# Patient Record
Sex: Male | Born: 1975 | Race: White | Hispanic: No | Marital: Married | Smoking: Current every day smoker
Health system: Southern US, Community
[De-identification: ages and names within clinical notes are randomized; demographics above are authoritative.]

---

## 2002-02-03 ENCOUNTER — Inpatient Hospital Stay (HOSPITAL_COMMUNITY): Admission: AC | Admit: 2002-02-03 | Discharge: 2002-02-06 | Payer: Self-pay

## 2002-02-03 ENCOUNTER — Encounter: Payer: Self-pay | Admitting: Emergency Medicine

## 2002-02-04 ENCOUNTER — Encounter: Payer: Self-pay | Admitting: General Surgery

## 2002-02-17 ENCOUNTER — Ambulatory Visit (HOSPITAL_BASED_OUTPATIENT_CLINIC_OR_DEPARTMENT_OTHER): Admission: RE | Admit: 2002-02-17 | Discharge: 2002-02-18 | Payer: Self-pay | Admitting: Orthopedic Surgery

## 2002-07-10 ENCOUNTER — Encounter (INDEPENDENT_AMBULATORY_CARE_PROVIDER_SITE_OTHER): Payer: Self-pay | Admitting: *Deleted

## 2002-07-10 ENCOUNTER — Ambulatory Visit (HOSPITAL_BASED_OUTPATIENT_CLINIC_OR_DEPARTMENT_OTHER): Admission: RE | Admit: 2002-07-10 | Discharge: 2002-07-10 | Payer: Self-pay | Admitting: Urology

## 2010-05-06 ENCOUNTER — Encounter: Payer: Self-pay | Admitting: Emergency Medicine

## 2010-05-08 ENCOUNTER — Emergency Department (HOSPITAL_COMMUNITY)
Admission: EM | Admit: 2010-05-08 | Discharge: 2010-05-08 | Payer: Self-pay | Source: Home / Self Care | Admitting: Emergency Medicine

## 2010-05-09 LAB — URINALYSIS, ROUTINE W REFLEX MICROSCOPIC
Bilirubin Urine: NEGATIVE
Ketones, ur: NEGATIVE mg/dL
Leukocytes, UA: NEGATIVE
Nitrite: NEGATIVE
Protein, ur: NEGATIVE mg/dL
Specific Gravity, Urine: 1.023 (ref 1.005–1.030)
Urine Glucose, Fasting: NEGATIVE mg/dL
Urobilinogen, UA: 0.2 mg/dL (ref 0.0–1.0)
pH: 6 (ref 5.0–8.0)

## 2010-05-09 LAB — URINE MICROSCOPIC-ADD ON

## 2010-09-01 NOTE — Op Note (Signed)
NAME:  Spencer Ward, Spencer Ward                         ACCOUNT NO.:  [id number]   MEDICAL RECORD NO.:  [id number]                   PATIENT TYPE:  INP   LOCATION:  5731                                 FACILITY:  [facility]   PHYSICIAN:  Katy Fitch. Naaman Plummer., M.D.          DATE OF BIRTH:  30-Mar-1976   DATE OF PROCEDURE:  02/05/2002  DATE OF DISCHARGE:                                 OPERATIVE REPORT   PREOPERATIVE DIAGNOSES:  Severely comminuted intra-articular fracture, right  distal radius sustained in a 40 foot fall from a tree.   POSTOPERATIVE DIAGNOSES:  Severely comminuted intra-articular fracture,  right distal radius sustained in a 40 foot fall from a tree.   OPERATION PERFORMED:  Open reduction internal fixation of right distal  radius fracture with freeze dried allograft cancellous bone graft and  application of a DVR volar plate system.   SURGEON:  Katy Fitch. Sypher, M.D.   ASSISTANT:  Jonni Sanger, P.A.   ANESTHESIA:  Axillary block supplemented by IV sedation.   SUPERVISING ANESTHESIOLOGIST:  Dr. Zoila Shutter.   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   Clinical examination by the emergency room staff and Jene Every, MD,  attending orthopedic surgeon on trauma call revealed a severely comminuted  fracture of the right distal radius.  Dr. Shelle Iron requested an upper extremity  orthopedic consult.   After informed consent, Dr. Shelle Iron completed a closed reduction of the  fracture due to significant median nerve compression due to the degree of  displacement.  Dr. Shelle Iron subsequently placed the patient  in a sugar tong  splint and requested a consultation from the upper extremity orthopedic  service.   Upon review of  the films we recommended scheduling open reduction internal  fixation with bone graft and application of a volar DVR buttress plate  system.  After informed consent, the patient is brought to the operating  room at this time.   DESCRIPTION OF PROCEDURE:  The patient was brought to the operating room and  placed in supine position upon the operating table.  Following placement of  an axillary block in the holding area by Dr. Zoila Shutter, anesthesia was  complete in the right arm.  The arm was then prepped with Betadine soap and  solution and sterilely draped.  1 gm of Ancef was administered as an IV  prophylactic antibiotic.  The procedure commenced with exsanguination of the  arm with an Esmarch bandage and placement of an arterial tourniquet to 250  mmHg at the proximal brachium.  The fracture was exposed through an extended  DVR volar incision paralleling the thenar crease and the flexor carpi  radialis tendon.   The subcutaneous  tissues were carefully divided taking care to identify the  palmaris longus, the palmar cutaneous branch of the median nerve and the  flexor carpi radialis.  The fascia overlying the flexor carpi radialis was  released and the tendons retracted in an ulnar direction.  The floor of the  flexor carpi radialis compartment was incised revealing the flexor pollicis  longus.  This was retracted in an ulnar direction revealing the pronator  quadratus.  The pronator quadratus was elevated off of the radius towards  its ulnar origin.   The fracture site was severely comminuted with marked impaction of the joint  surface on this dorsal and distal surfaces.  The styloid was comminuted as  was the lunate facet.  With the use of a Kleinert elevator and other blunt  instruments, the fracture was disimpacted from inside out technique passing  instruments from the radial aspect of the radial styloid region towards the  lunate facet.   With multiple passes of the Kleinert  elevator and the rasp, I was able to  elevate all the fracture fragments in anatomic position confirmed by C-arm  fluoroscopy.  Freeze dried cancellous allograft was then placed in the  cavity created in the cavity created by impaction of the bone fragments  filling this to a significant density to support the fracture in an anatomic  reduction.   The DVR plate system was then applied to the volar aspect of the wrist and  multiple C-arm images were used to confirm satisfactory position of the  plate.  Four nonthreaded pegs were placed deep to the articular surface  maintaining satisfactory reduction and support of the articular surface.   Further bone graft was impacted securing an anatomic reduction of the  fracture.  The cortical fragment removed on the radial aspect of the  metaphysis was replaced anatomically and the periosteum closed.  The plate  was secured to the radial shaft with the 2.5 mm self tapping cortical  screws.   An anatomic reduction of the fracture was achieved ___________ of length,  slope and tilt of the distal radius.  The pronator quadratus was then repaired to the radial periosteum on the  distal radius followed by closure of the skin with subdermal sutures of 3-0  Vicryl and intradermal 3-0 Prolene.   Compressive dressing was applied with a sugar tong splint maintaining the  forearm in neutral position.  There were no apparent complications.  The  patient tolerated surgery and anesthesia well.  He was transferred to the  recovery room with stable vital signs.                                                 Katy Fitch Naaman Plummer., M.D.    RVS/MEDQ  D:  02/05/2002  T:  02/05/2002  Job:  [id number]   cc:   Jene Every, MD  [address]  [place]  [place] [address]  Fax: [phone]

## 2010-09-01 NOTE — Op Note (Signed)
NAME:  Spencer Ward, Spencer Ward                         ACCOUNT NO.:  [id number]   MEDICAL RECORD NO.:  [id number]                   PATIENT TYPE:  AMB   LOCATION:  NESC                                 FACILITY:  [facility]   PHYSICIAN:  [place] Service, M.D.             DATE OF BIRTH:  [birth year]   DATE OF PROCEDURE:  07/10/2002  DATE OF DISCHARGE:                                 OPERATIVE REPORT   PREOPERATIVE DIAGNOSIS:  A 35 year old male, longstanding painless swelling  in the right hemiscrotum.  Scrotal ultrasound confirms 8 x 6 x 7 cm right  hydrocele, single internal septation, no internal echos, normal parenchyma  right and left testis.  Insignificant amount of fluid on the left side.  The  patient has undesired fertility.  He and wife agree to outpatient vasectomy.   POSTOPERATIVE DIAGNOSIS:  A 35 year old male, longstanding painless swelling  in the right hemiscrotum.  Scrotal ultrasound confirms 8 x 6 x 7 cm right  hydrocele, single internal septation, no internal echos, normal parenchyma  right and left testis.  Insignificant amount of fluid on the left side.  The  patient has undesired fertility.  He and wife agree to outpatient vasectomy.   PROCEDURES:  1. Right hydrocelectomy.  2. Vasectomy.   DESCRIPTION OF PROCEDURE:  The patient was prepped and draped in the supine  position after institution of an adequate level of general anesthesia.  Transverse incision was made in the anterior aspect of the right  hemiscrotum, carried down to the skin and dartos.  General pressure on the  wound edges produced a pearl-gray hydrocele sac.  Continued blunt dissection  freed the sac from its surrounding subcutaneous attachments.  Sac was  incised longitudinally and drained about 250-300 mL of straw-colored fluid.  Cyst was then sewn back on itself.  No attempt was made to resect the cyst  wall.  Testis appeared normal.  Appendix testis was fulgurated.  Testis was  then replaced into the  right hemiscrotum.  The deep dartos layer was closed  with a running suture of 3-0 chromic; superficial dartos was closed with a  running suture of 2-0 Vicryl; skin was closed with interrupted sutures of 3-  0 chromic.  Prior to closure, vas had been brought to the anterior aspect of  the cord, divided proximally and distally.  Approximately a 1.5 cm segment  was removed.  Each end was ligated with 4-0 Vicryl.   Attention was then focused on the left hemiscrotum.  Vas was brought to the  anterior scrotal skin.  Small transverse incision was made through the skin  and dartos.  Vas was brought up, ligated proximally and distally.  About a 1  cm segment was removed.  Dartos was closed with a single suture of 3-0  chromic.  Skin was closed with a single suture of 3-0 chromic.  The  patient's wound was covered with Bacitracin ointment, dry gauze,  athletic  supporter, and an ice bag. He was returned to recovery in satisfactory  condition.                                               Boston Service, M.D.    RH/MEDQ  D:  07/10/2002  T:  07/10/2002  Job:  [id number]

## 2010-09-01 NOTE — Op Note (Signed)
NAME:  Spencer Ward, Spencer Ward                         ACCOUNT NO.:  [id number]   MEDICAL RECORD NO.:  [id number]                   PATIENT TYPE:  AMB   LOCATION:  DSC                                  FACILITY:  [facility]   PHYSICIAN:  Leonides Grills, M.D.                  DATE OF BIRTH:  12/01/1975   DATE OF PROCEDURE:  02/17/2002  DATE OF DISCHARGE:                                 OPERATIVE REPORT   PREOPERATIVE DIAGNOSIS:  Right closed calcaneus fracture.   POSTOPERATIVE DIAGNOSIS:  Right closed calcaneus fracture.   PROCEDURE:  Open reduction and internal fixation of right calcaneus  fracture.   ANESTHESIA:  General endotracheal tube.   SURGEON:  Leonides Grills, M.D.   ASSISTANT:  Lianne Cure, P.A.   ESTIMATED BLOOD LOSS:  Minimal.   TOURNIQUET TIME:  Approximately 1-1/2 hours.   COMPLICATIONS:  None.   DISPOSITION:  Stable to the PAR.   INDICATIONS:  This is a 35 year old male who fell 40 feet out of a tree and  sustained a right closed calcaneus fracture as well as a right comminuted  distal radius fracture.  The distal radius fracture was already fixed by  Katy Fitch. Sypher, M.D., and after elevation for two weeks for soft tissue  swelling as well as recovery, he was brought back for open reduction and  internal fixation.  He was explained the risks, which include infection,  nerve or vessel injury, skin breakdown, arthritis, stiffness, and possible  fusion in the future, were all explained, questions were encouraged and  answered.   DESCRIPTION OF PROCEDURE:  Patient brought to the operating room and placed  in the supine position initially after adequate general endotracheal tube  anesthesia was administered as well as Ancef 1 g IV piggyback.  A proximally-  placed thigh tourniquet was then placed and the patient was placed in a  lateral decubitus position with the right operative side up.  The right  lower extremity was then prepped and draped in a sterile manner.  An  L-  shaped [place] extensile approach to the lateral wall of the calcaneus was  then made, the sural nerve was identified in the proximalmost aspect of the  wound and protected, retracting anteriorly.  We then identified the lateral  wall, which was then removed.  Once the lateral wall was removed, we then  identified the two fracture fragments of the lateral third of the posterior  facet.  We then placed a Shands pin into the calcaneal tuber and with  distraction, valgus and medial translation, we then reduced the medial wall  of the calcaneus and provisionally fixed this with a 2.0 mm K-wire.  Once  this was provisionally fixed, we then reduced posterior facet as well as the  angle of the same.  Once this was fixed with 1.25 mm K-wires, we then  applied a [place]nel plate that was cut and bent  anatomically.  At this point  we also placed the lateral wall back onto the calcaneus and we also obtained  C-arm views in the lateral and axial views to show that the medial wall was  reduced as well as the posterior facet.  The posterior facet was also  visualized and palpated with a Therapist, nutritional.  Once this was completely  copiously irrigated with normal saline and the plate was applied, we then  applied compression screws across using the plate to reduce the lateral  wall.  This had an excellent compression of the lateral wall as well as  maintenance of the reduction.  The 2.0 mm K-wire was then removed.  The K-  wires that were holding the posterior facet fragment were backed out, cut,  and tamped back into place.  Final x-rays in the lateral, Broden, and axial  views were obtained and showed an anatomic reduction and maintenance of the  reduction.  Also, too, of note, we used a no-touch technique for the flap  that was elevated, and K-wires were placed in the corner of the talus and  bent with a fish-tip sucker to hold the flap retracted without touching it  the entire case.  At the end of  the case the K-wires were removed from the  talus, and the flap was viable.  Hemostasis was obtained.  The tourniquet  was deflated after an hour and a half, minimal bleeding.  The wound was  closed with 2-0 Vicryl, skin was closed with 4-0 nylon.  A sterile dressing  was applied, Jones dressing applied with the ankle in neutral dorsiflexion.  Patient stable to PAR.                                               Leonides Grills, M.D.    PB/MEDQ  D:  02/17/2002  T:  02/18/2002  Job:  [id number]
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
## 2015-01-03 ENCOUNTER — Inpatient Hospital Stay (HOSPITAL_COMMUNITY)
Admission: EM | Admit: 2015-01-03 | Discharge: 2015-01-06 | DRG: 958 | Disposition: A | Discharge status: Home / Self Care | Payer: Self-pay | Attending: General Surgery | Admitting: General Surgery

## 2015-01-03 ENCOUNTER — Encounter (HOSPITAL_COMMUNITY): Payer: Self-pay

## 2015-01-03 ENCOUNTER — Emergency Department (HOSPITAL_COMMUNITY): Payer: Self-pay | Admitting: Certified Registered"

## 2015-01-03 ENCOUNTER — Emergency Department (HOSPITAL_COMMUNITY): Payer: Self-pay

## 2015-01-03 ENCOUNTER — Encounter (HOSPITAL_COMMUNITY): Admission: EM | Disposition: A | Discharge status: Home / Self Care | Payer: Self-pay | Source: Home / Self Care

## 2015-01-03 ENCOUNTER — Emergency Department (HOSPITAL_COMMUNITY): Payer: MEDICAID | Admitting: Certified Registered"

## 2015-01-03 ENCOUNTER — Emergency Department (HOSPITAL_COMMUNITY): Payer: MEDICAID

## 2015-01-03 DIAGNOSIS — S41112A Laceration without foreign body of left upper arm, initial encounter: Secondary | ICD-10-CM

## 2015-01-03 DIAGNOSIS — N179 Acute kidney failure, unspecified: Secondary | ICD-10-CM | POA: Diagnosis not present

## 2015-01-03 DIAGNOSIS — S55192A Other specified injury of radial artery at forearm level, left arm, initial encounter: Secondary | ICD-10-CM

## 2015-01-03 DIAGNOSIS — S270XXA Traumatic pneumothorax, initial encounter: Secondary | ICD-10-CM | POA: Diagnosis present

## 2015-01-03 DIAGNOSIS — S45102A Unspecified injury of brachial artery, left side, initial encounter: Secondary | ICD-10-CM

## 2015-01-03 DIAGNOSIS — S45192A Other specified injury of brachial artery, left side, initial encounter: Secondary | ICD-10-CM

## 2015-01-03 DIAGNOSIS — S45112A Laceration of brachial artery, left side, initial encounter: Secondary | ICD-10-CM | POA: Diagnosis present

## 2015-01-03 DIAGNOSIS — S2249XA Multiple fractures of ribs, unspecified side, initial encounter for closed fracture: Secondary | ICD-10-CM

## 2015-01-03 DIAGNOSIS — S55112A Laceration of radial artery at forearm level, left arm, initial encounter: Principal | ICD-10-CM | POA: Diagnosis present

## 2015-01-03 DIAGNOSIS — S2242XA Multiple fractures of ribs, left side, initial encounter for closed fracture: Secondary | ICD-10-CM | POA: Diagnosis present

## 2015-01-03 DIAGNOSIS — S0101XA Laceration without foreign body of scalp, initial encounter: Secondary | ICD-10-CM | POA: Diagnosis present

## 2015-01-03 DIAGNOSIS — T07XXXA Unspecified multiple injuries, initial encounter: Secondary | ICD-10-CM

## 2015-01-03 DIAGNOSIS — F17210 Nicotine dependence, cigarettes, uncomplicated: Secondary | ICD-10-CM | POA: Diagnosis present

## 2015-01-03 DIAGNOSIS — S51011A Laceration without foreign body of right elbow, initial encounter: Secondary | ICD-10-CM | POA: Diagnosis present

## 2015-01-03 DIAGNOSIS — IMO0002 Reserved for concepts with insufficient information to code with codable children: Secondary | ICD-10-CM

## 2015-01-03 DIAGNOSIS — S2239XA Fracture of one rib, unspecified side, initial encounter for closed fracture: Secondary | ICD-10-CM

## 2015-01-03 DIAGNOSIS — D62 Acute posthemorrhagic anemia: Secondary | ICD-10-CM | POA: Diagnosis present

## 2015-01-03 DIAGNOSIS — K5900 Constipation, unspecified: Secondary | ICD-10-CM | POA: Diagnosis not present

## 2015-01-03 DIAGNOSIS — S2232XA Fracture of one rib, left side, initial encounter for closed fracture: Secondary | ICD-10-CM | POA: Diagnosis present

## 2015-01-03 HISTORY — PX: RADIAL ARTERY HARVEST: SHX5067

## 2015-01-03 HISTORY — PX: ARTERY REPAIR: SHX559

## 2015-01-03 HISTORY — PX: VEIN HARVEST: SHX6363

## 2015-01-03 LAB — RAPID URINE DRUG SCREEN, HOSP PERFORMED
Amphetamines: NOT DETECTED
Barbiturates: NOT DETECTED
Benzodiazepines: POSITIVE — AB
Cocaine: NOT DETECTED
Opiates: POSITIVE — AB
Tetrahydrocannabinol: POSITIVE — AB

## 2015-01-03 LAB — BASIC METABOLIC PANEL
Anion gap: 10 (ref 5–15)
BUN: 10 mg/dL (ref 6–20)
CO2: 23 mmol/L (ref 22–32)
CREATININE: 1.02 mg/dL (ref 0.61–1.24)
Calcium: 8.5 mg/dL — ABNORMAL LOW (ref 8.9–10.3)
Chloride: 106 mmol/L (ref 101–111)
GFR calc Af Amer: >60 mL/min (ref >60)
GLUCOSE: 140 mg/dL — AB (ref 65–99)
POTASSIUM: 4.8 mmol/L (ref 3.5–5.1)
SODIUM: 139 mmol/L (ref 135–145)

## 2015-01-03 LAB — CBC
HCT: 44.8 % (ref 39.0–52.0)
HCT: 30.5 % — ABNORMAL LOW (ref 39.0–52.0)
Hemoglobin: 15.7 g/dL (ref 13.0–17.0)
Hemoglobin: 10.8 g/dL — ABNORMAL LOW (ref 13.0–17.0)
MCH: 30.8 pg (ref 26.0–34.0)
MCH: 30.8 pg (ref 26.0–34.0)
MCHC: 35 g/dL (ref 30.0–36.0)
MCHC: 35.4 g/dL (ref 30.0–36.0)
MCV: 87.8 fL (ref 78.0–100.0)
MCV: 86.9 fL (ref 78.0–100.0)
PLATELETS: 185 10*3/uL (ref 150–400)
Platelets: 329 10*3/uL (ref 150–400)
RBC: 5.1 MIL/uL (ref 4.22–5.81)
RBC: 3.51 MIL/uL — ABNORMAL LOW (ref 4.22–5.81)
RDW: 12.8 % (ref 11.5–15.5)
RDW: 13.2 % (ref 11.5–15.5)
WBC: 19.9 10*3/uL — ABNORMAL HIGH (ref 4.0–10.5)
WBC: 18.4 10*3/uL — ABNORMAL HIGH (ref 4.0–10.5)

## 2015-01-03 LAB — COMPREHENSIVE METABOLIC PANEL WITH GFR
ALT: 186 U/L — ABNORMAL HIGH (ref 17–63)
AST: 202 U/L — ABNORMAL HIGH (ref 15–41)
Albumin: 4.2 g/dL (ref 3.5–5.0)
Alkaline Phosphatase: 46 U/L (ref 38–126)
Anion gap: 14 (ref 5–15)
BUN: 12 mg/dL (ref 6–20)
CO2: 18 mmol/L — ABNORMAL LOW (ref 22–32)
Calcium: 8.4 mg/dL — ABNORMAL LOW (ref 8.9–10.3)
Chloride: 107 mmol/L (ref 101–111)
Creatinine, Ser: 1.31 mg/dL — ABNORMAL HIGH (ref 0.61–1.24)
GFR calc Af Amer: >60 mL/min
GFR calc non Af Amer: >60 mL/min
Glucose, Bld: 228 mg/dL — ABNORMAL HIGH (ref 65–99)
Potassium: 4 mmol/L (ref 3.5–5.1)
Sodium: 139 mmol/L (ref 135–145)
Total Bilirubin: 0.9 mg/dL (ref 0.3–1.2)
Total Protein: 6.4 g/dL — ABNORMAL LOW (ref 6.5–8.1)

## 2015-01-03 LAB — TYPE AND SCREEN
ABO/RH(D): A POS
Antibody Screen: NEGATIVE
Unit division: 0
Unit division: 0

## 2015-01-03 LAB — I-STAT CG4 LACTIC ACID, ED: Lactic Acid, Venous: 3.83 mmol/L (ref 0.5–2.0)

## 2015-01-03 LAB — URINALYSIS, ROUTINE W REFLEX MICROSCOPIC
Bilirubin Urine: NEGATIVE
GLUCOSE, UA: NEGATIVE mg/dL
KETONES UR: NEGATIVE mg/dL
LEUKOCYTES UA: NEGATIVE
NITRITE: NEGATIVE
PH: 5 (ref 5.0–8.0)
Protein, ur: NEGATIVE mg/dL
Specific Gravity, Urine: 1.025 (ref 1.005–1.030)
Urobilinogen, UA: 0.2 mg/dL (ref 0.0–1.0)

## 2015-01-03 LAB — PREPARE FRESH FROZEN PLASMA
UNIT DIVISION: 0
UNIT DIVISION: 0

## 2015-01-03 LAB — PROTIME-INR
INR: 1.11 (ref 0.00–1.49)
Prothrombin Time: 14.5 s (ref 11.6–15.2)

## 2015-01-03 LAB — URINE MICROSCOPIC-ADD ON

## 2015-01-03 LAB — SURGICAL PCR SCREEN
MRSA, PCR: NEGATIVE
Staphylococcus aureus: NEGATIVE

## 2015-01-03 LAB — ABO/RH: ABO/RH(D): A POS

## 2015-01-03 LAB — BLOOD PRODUCT ORDER (VERBAL) VERIFICATION

## 2015-01-03 SURGERY — REPAIR, ARTERY, BRACHIAL
Anesthesia: General | Site: Leg Upper | Laterality: Left

## 2015-01-03 MED ORDER — HYDROMORPHONE HCL 1 MG/ML IJ SOLN
2.0000 mg | Freq: Once | INTRAMUSCULAR | Status: AC
  Administered 2015-01-03: 2 mg via INTRAVENOUS
  Filled 2015-01-03: qty 2

## 2015-01-03 MED ORDER — LACTATED RINGERS IV SOLN
INTRAVENOUS | Status: DC | PRN
  Administered 2015-01-03 (×4): via INTRAVENOUS

## 2015-01-03 MED ORDER — LIDOCAINE HCL (CARDIAC) 20 MG/ML IV SOLN
INTRAVENOUS | Status: AC
  Administered 2015-01-03: 100 mg via INTRAVENOUS
  Filled 2015-01-03: qty 5

## 2015-01-03 MED ORDER — TETANUS-DIPHTH-ACELL PERTUSSIS 5-2.5-18.5 LF-MCG/0.5 IM SUSP
INTRAMUSCULAR | Status: AC
  Filled 2015-01-03: qty 0.5

## 2015-01-03 MED ORDER — IOHEXOL 350 MG/ML SOLN
100.0000 mL | Freq: Once | INTRAVENOUS | Status: AC | PRN
  Administered 2015-01-03: 100 mL via INTRAVENOUS

## 2015-01-03 MED ORDER — DIPHENHYDRAMINE HCL 12.5 MG/5ML PO ELIX
12.5000 mg | ORAL_SOLUTION | Freq: Four times a day (QID) | ORAL | Status: DC | PRN
  Filled 2015-01-03: qty 5

## 2015-01-03 MED ORDER — MIDAZOLAM HCL 2 MG/2ML IJ SOLN
INTRAMUSCULAR | Status: AC
  Filled 2015-01-03: qty 4

## 2015-01-03 MED ORDER — DEXAMETHASONE SODIUM PHOSPHATE 4 MG/ML IJ SOLN
INTRAMUSCULAR | Status: DC | PRN
  Administered 2015-01-03: 4 mg via INTRAVENOUS

## 2015-01-03 MED ORDER — CALCIUM CHLORIDE 10 % IV SOLN
INTRAVENOUS | Status: AC
  Filled 2015-01-03: qty 10

## 2015-01-03 MED ORDER — IPRATROPIUM-ALBUTEROL 0.5-2.5 (3) MG/3ML IN SOLN
3.0000 mL | Freq: Four times a day (QID) | RESPIRATORY_TRACT | Status: DC
  Administered 2015-01-03 – 2015-01-04 (×6): 3 mL via RESPIRATORY_TRACT
  Filled 2015-01-03 (×6): qty 3

## 2015-01-03 MED ORDER — HYDROMORPHONE 0.3 MG/ML IV SOLN
INTRAVENOUS | Status: DC
  Administered 2015-01-03: 3.9 mg via INTRAVENOUS
  Administered 2015-01-03: 10:00:00 via INTRAVENOUS
  Administered 2015-01-03: 4.5 mg via INTRAVENOUS
  Administered 2015-01-03: 2.4 mg via INTRAVENOUS
  Administered 2015-01-04: 3.9 mg via INTRAVENOUS
  Administered 2015-01-04: 3.72 mg via INTRAVENOUS
  Filled 2015-01-03 (×2): qty 25

## 2015-01-03 MED ORDER — FENTANYL CITRATE (PF) 250 MCG/5ML IJ SOLN
INTRAMUSCULAR | Status: AC
  Filled 2015-01-03: qty 5

## 2015-01-03 MED ORDER — PROTAMINE SULFATE 10 MG/ML IV SOLN
INTRAVENOUS | Status: AC
  Filled 2015-01-03: qty 5

## 2015-01-03 MED ORDER — SODIUM CHLORIDE 0.9 % IJ SOLN: 9.0000 mL | INTRAMUSCULAR | Status: DC | PRN

## 2015-01-03 MED ORDER — PROTAMINE SULFATE 10 MG/ML IV SOLN
INTRAVENOUS | Status: DC | PRN
  Administered 2015-01-03: 50 mg via INTRAVENOUS

## 2015-01-03 MED ORDER — 0.9 % SODIUM CHLORIDE (POUR BTL) OPTIME
TOPICAL | Status: DC | PRN
  Administered 2015-01-03: 1000 mL

## 2015-01-03 MED ORDER — SUCCINYLCHOLINE CHLORIDE 20 MG/ML IJ SOLN
INTRAMUSCULAR | Status: AC
  Filled 2015-01-03: qty 1

## 2015-01-03 MED ORDER — ONDANSETRON HCL 4 MG/2ML IJ SOLN: 4.0000 mg | Freq: Four times a day (QID) | INTRAMUSCULAR | Status: DC | PRN

## 2015-01-03 MED ORDER — HYDROMORPHONE HCL 1 MG/ML IJ SOLN
2.0000 mg | INTRAMUSCULAR | Status: DC | PRN
  Administered 2015-01-03: 2 mg via INTRAVENOUS
  Filled 2015-01-03: qty 2

## 2015-01-03 MED ORDER — PROPOFOL 10 MG/ML IV BOLUS
INTRAVENOUS | Status: AC
  Filled 2015-01-03: qty 20

## 2015-01-03 MED ORDER — SODIUM CHLORIDE 0.9 % IV SOLN
1000.0000 mL | Freq: Once | INTRAVENOUS | Status: AC
  Administered 2015-01-03: 1000 mL via INTRAVENOUS

## 2015-01-03 MED ORDER — DIPHENHYDRAMINE HCL 50 MG/ML IJ SOLN: 12.5000 mg | Freq: Four times a day (QID) | INTRAMUSCULAR | Status: DC | PRN

## 2015-01-03 MED ORDER — PHENYLEPHRINE 40 MCG/ML (10ML) SYRINGE FOR IV PUSH (FOR BLOOD PRESSURE SUPPORT)
PREFILLED_SYRINGE | INTRAVENOUS | Status: AC
  Filled 2015-01-03: qty 10

## 2015-01-03 MED ORDER — ETOMIDATE 2 MG/ML IV SOLN
INTRAVENOUS | Status: AC
  Filled 2015-01-03: qty 20

## 2015-01-03 MED ORDER — SODIUM CHLORIDE 0.9 % IV SOLN
1000.0000 mL | INTRAVENOUS | Status: DC
  Administered 2015-01-03: 1000 mL via INTRAVENOUS

## 2015-01-03 MED ORDER — SUFENTANIL CITRATE 50 MCG/ML IV SOLN
INTRAVENOUS | Status: DC | PRN
  Administered 2015-01-03 (×2): 10 ug via INTRAVENOUS
  Administered 2015-01-03: 20 ug via INTRAVENOUS
  Administered 2015-01-03: 10 ug via INTRAVENOUS

## 2015-01-03 MED ORDER — KCL IN DEXTROSE-NACL 20-5-0.45 MEQ/L-%-% IV SOLN
INTRAVENOUS | Status: DC
  Administered 2015-01-03 – 2015-01-04 (×3): via INTRAVENOUS
  Filled 2015-01-03 (×4): qty 1000

## 2015-01-03 MED ORDER — OXYCODONE HCL 5 MG PO TABS
10.0000 mg | ORAL_TABLET | ORAL | Status: DC | PRN
  Administered 2015-01-03: 10 mg via ORAL
  Administered 2015-01-04 (×2): 15 mg via ORAL
  Filled 2015-01-03 (×2): qty 3
  Filled 2015-01-03: qty 2

## 2015-01-03 MED ORDER — FENTANYL CITRATE (PF) 100 MCG/2ML IJ SOLN
INTRAMUSCULAR | Status: AC | PRN
  Administered 2015-01-03 (×2): 25 ug via INTRAVENOUS

## 2015-01-03 MED ORDER — BACITRACIN-NEOMYCIN-POLYMYXIN OINTMENT TUBE
TOPICAL_OINTMENT | Freq: Two times a day (BID) | CUTANEOUS | Status: DC
Start: 2015-01-03 — End: 2015-01-06
  Administered 2015-01-03 – 2015-01-06 (×6): via TOPICAL
  Filled 2015-01-03: qty 15

## 2015-01-03 MED ORDER — DEXAMETHASONE SODIUM PHOSPHATE 4 MG/ML IJ SOLN
INTRAMUSCULAR | Status: AC
  Filled 2015-01-03: qty 1

## 2015-01-03 MED ORDER — HYDROMORPHONE 0.3 MG/ML IV SOLN
INTRAVENOUS | Status: AC
  Filled 2015-01-03: qty 25

## 2015-01-03 MED ORDER — SODIUM CHLORIDE 0.9 % IV SOLN
INTRAVENOUS | Status: DC | PRN
  Administered 2015-01-03: 500 mL

## 2015-01-03 MED ORDER — LIDOCAINE HCL (CARDIAC) 20 MG/ML IV SOLN
INTRAVENOUS | Status: AC
  Filled 2015-01-03: qty 5

## 2015-01-03 MED ORDER — TETANUS-DIPHTH-ACELL PERTUSSIS 5-2.5-18.5 LF-MCG/0.5 IM SUSP
0.5000 mL | Freq: Once | INTRAMUSCULAR | Status: AC
  Administered 2015-01-03: 0.5 mL via INTRAMUSCULAR

## 2015-01-03 MED ORDER — PHENYLEPHRINE HCL 10 MG/ML IJ SOLN
INTRAMUSCULAR | Status: DC | PRN
  Administered 2015-01-03: 80 ug via INTRAVENOUS
  Administered 2015-01-03: 40 ug via INTRAVENOUS
  Administered 2015-01-03: 80 ug via INTRAVENOUS
  Administered 2015-01-03: 120 ug via INTRAVENOUS
  Administered 2015-01-03: 80 ug via INTRAVENOUS
  Administered 2015-01-03: 40 ug via INTRAVENOUS

## 2015-01-03 MED ORDER — SODIUM CHLORIDE 0.9 % IJ SOLN
INTRAMUSCULAR | Status: AC
  Filled 2015-01-03: qty 10

## 2015-01-03 MED ORDER — ROCURONIUM BROMIDE 50 MG/5ML IV SOLN
INTRAVENOUS | Status: AC
  Filled 2015-01-03: qty 2

## 2015-01-03 MED ORDER — PHENYLEPHRINE 40 MCG/ML (10ML) SYRINGE FOR IV PUSH (FOR BLOOD PRESSURE SUPPORT)
PREFILLED_SYRINGE | INTRAVENOUS | Status: AC
  Filled 2015-01-03: qty 20

## 2015-01-03 MED ORDER — PROPOFOL 10 MG/ML IV BOLUS
INTRAVENOUS | Status: DC | PRN
  Administered 2015-01-03: 100 mg via INTRAVENOUS
  Administered 2015-01-03: 150 mg via INTRAVENOUS
  Administered 2015-01-03: 60 mg via INTRAVENOUS

## 2015-01-03 MED ORDER — MIDAZOLAM HCL 5 MG/5ML IJ SOLN
INTRAMUSCULAR | Status: DC | PRN
  Administered 2015-01-03: 2 mg via INTRAVENOUS

## 2015-01-03 MED ORDER — BACITRACIN ZINC 500 UNIT/GM EX OINT
TOPICAL_OINTMENT | CUTANEOUS | Status: DC | PRN
  Administered 2015-01-03: 1 via TOPICAL

## 2015-01-03 MED ORDER — CEFAZOLIN SODIUM-DEXTROSE 2-3 GM-% IV SOLR
2.0000 g | Freq: Once | INTRAVENOUS | Status: AC
  Administered 2015-01-03: 2 g via INTRAVENOUS

## 2015-01-03 MED ORDER — ALBUMIN HUMAN 5 % IV SOLN
INTRAVENOUS | Status: DC | PRN
  Administered 2015-01-03 (×2): via INTRAVENOUS

## 2015-01-03 MED ORDER — SUFENTANIL CITRATE 50 MCG/ML IV SOLN
INTRAVENOUS | Status: AC
  Filled 2015-01-03: qty 1

## 2015-01-03 MED ORDER — PHENYLEPHRINE HCL 10 MG/ML IJ SOLN
10.0000 mg | INTRAVENOUS | Status: DC | PRN
  Administered 2015-01-03: 50 ug/min via INTRAVENOUS

## 2015-01-03 MED ORDER — CEFAZOLIN SODIUM-DEXTROSE 2-3 GM-% IV SOLR
INTRAVENOUS | Status: AC
  Filled 2015-01-03: qty 50

## 2015-01-03 MED ORDER — CEFAZOLIN SODIUM-DEXTROSE 2-3 GM-% IV SOLR
INTRAVENOUS | Status: DC | PRN
  Administered 2015-01-03: 2 g via INTRAVENOUS

## 2015-01-03 MED ORDER — FENTANYL CITRATE (PF) 250 MCG/5ML IJ SOLN
INTRAMUSCULAR | Status: DC | PRN
  Administered 2015-01-03 (×3): 50 ug via INTRAVENOUS
  Administered 2015-01-03: 75 ug via INTRAVENOUS
  Administered 2015-01-03: 25 ug via INTRAVENOUS

## 2015-01-03 MED ORDER — HEPARIN SODIUM (PORCINE) 1000 UNIT/ML IJ SOLN
INTRAMUSCULAR | Status: DC | PRN
  Administered 2015-01-03: 8000 [IU] via INTRAVENOUS
  Administered 2015-01-03: 2000 [IU] via INTRAVENOUS

## 2015-01-03 MED ORDER — PANTOPRAZOLE SODIUM 40 MG PO TBEC
40.0000 mg | DELAYED_RELEASE_TABLET | Freq: Every day | ORAL | Status: DC
  Administered 2015-01-03 – 2015-01-05 (×3): 40 mg via ORAL
  Filled 2015-01-03 (×3): qty 1

## 2015-01-03 MED ORDER — ONDANSETRON HCL 4 MG PO TABS: 4.0000 mg | ORAL_TABLET | Freq: Four times a day (QID) | ORAL | Status: DC | PRN

## 2015-01-03 MED ORDER — FENTANYL CITRATE (PF) 100 MCG/2ML IJ SOLN
INTRAMUSCULAR | Status: AC
  Filled 2015-01-03: qty 2

## 2015-01-03 MED ORDER — ONDANSETRON HCL 4 MG/2ML IJ SOLN
INTRAMUSCULAR | Status: DC | PRN
  Administered 2015-01-03: 4 mg via INTRAVENOUS

## 2015-01-03 MED ORDER — LIDOCAINE HCL (PF) 1 % IJ SOLN
INTRAMUSCULAR | Status: AC
  Filled 2015-01-03: qty 5

## 2015-01-03 MED ORDER — SUCCINYLCHOLINE CHLORIDE 20 MG/ML IJ SOLN
INTRAMUSCULAR | Status: AC
  Administered 2015-01-03: 120 mg via INTRAVENOUS
  Filled 2015-01-03: qty 1

## 2015-01-03 MED ORDER — PANTOPRAZOLE SODIUM 40 MG IV SOLR
40.0000 mg | Freq: Every day | INTRAVENOUS | Status: DC
  Filled 2015-01-03: qty 40

## 2015-01-03 MED ORDER — NALOXONE HCL 0.4 MG/ML IJ SOLN: 0.4000 mg | INTRAMUSCULAR | Status: DC | PRN

## 2015-01-03 SURGICAL SUPPLY — 67 items
BANDAGE ELASTIC 4 VELCRO ST LF (GAUZE/BANDAGES/DRESSINGS) IMPLANT
BANDAGE ESMARK 6X9 LF (GAUZE/BANDAGES/DRESSINGS) ×2 IMPLANT
BNDG ESMARK 6X9 LF (GAUZE/BANDAGES/DRESSINGS) ×4
BNDG GAUZE ELAST 4 BULKY (GAUZE/BANDAGES/DRESSINGS) ×4 IMPLANT
CANISTER SUCTION 2500CC (MISCELLANEOUS) ×4 IMPLANT
CATH EMB 3FR 40CM (CATHETERS) ×4 IMPLANT
CLIP TI MEDIUM 24 (CLIP) ×8 IMPLANT
CLIP TI WIDE RED SMALL 24 (CLIP) ×8 IMPLANT
CLIP TI WIDE RED SMALL 6 (CLIP) ×4 IMPLANT
CUFF TOURNIQUET SINGLE 24IN (TOURNIQUET CUFF) IMPLANT
CUFF TOURNIQUET SINGLE 34IN LL (TOURNIQUET CUFF) ×4 IMPLANT
CUFF TOURNIQUET SINGLE 44IN (TOURNIQUET CUFF) IMPLANT
DRAIN CHANNEL 15F RND FF W/TCR (WOUND CARE) IMPLANT
DRAPE ORTHO SPLIT 77X108 STRL (DRAPES) ×2
DRAPE SURG ORHT 6 SPLT 77X108 (DRAPES) ×2 IMPLANT
DRAPE X-RAY CASS 24X20 (DRAPES) IMPLANT
DRSG COVADERM 4X10 (GAUZE/BANDAGES/DRESSINGS) IMPLANT
DRSG COVADERM 4X8 (GAUZE/BANDAGES/DRESSINGS) IMPLANT
ELECT REM PT RETURN 9FT ADLT (ELECTROSURGICAL) ×4
ELECTRODE REM PT RTRN 9FT ADLT (ELECTROSURGICAL) ×2 IMPLANT
EVACUATOR SILICONE 100CC (DRAIN) IMPLANT
GLOVE BIO SURGEON STRL SZ 6.5 (GLOVE) ×3 IMPLANT
GLOVE BIO SURGEONS STRL SZ 6.5 (GLOVE) ×1
GLOVE BIOGEL PI IND STRL 6.5 (GLOVE) ×10 IMPLANT
GLOVE BIOGEL PI IND STRL 7.0 (GLOVE) ×4 IMPLANT
GLOVE BIOGEL PI IND STRL 7.5 (GLOVE) ×6 IMPLANT
GLOVE BIOGEL PI INDICATOR 6.5 (GLOVE) ×10
GLOVE BIOGEL PI INDICATOR 7.0 (GLOVE) ×4
GLOVE BIOGEL PI INDICATOR 7.5 (GLOVE) ×6
GLOVE ECLIPSE 6.5 STRL STRAW (GLOVE) ×4 IMPLANT
GLOVE SURG SS PI 6.0 STRL IVOR (GLOVE) ×4 IMPLANT
GLOVE SURG SS PI 7.5 STRL IVOR (GLOVE) ×12 IMPLANT
GOWN STRL REUS W/ TWL LRG LVL3 (GOWN DISPOSABLE) ×10 IMPLANT
GOWN STRL REUS W/ TWL XL LVL3 (GOWN DISPOSABLE) ×2 IMPLANT
GOWN STRL REUS W/TWL LRG LVL3 (GOWN DISPOSABLE) ×10
GOWN STRL REUS W/TWL XL LVL3 (GOWN DISPOSABLE) ×2
HEMOSTAT SNOW SURGICEL 2X4 (HEMOSTASIS) IMPLANT
IMMOBILIZER KNEE 20 (SOFTGOODS) ×4
IMMOBILIZER KNEE 20 THIGH 36 (SOFTGOODS) ×2 IMPLANT
KIT BASIN OR (CUSTOM PROCEDURE TRAY) ×4 IMPLANT
KIT ROOM TURNOVER OR (KITS) ×4 IMPLANT
LIQUID BAND (GAUZE/BANDAGES/DRESSINGS) ×4 IMPLANT
MARKER GRAFT CORONARY BYPASS (MISCELLANEOUS) IMPLANT
NS IRRIG 1000ML POUR BTL (IV SOLUTION) ×8 IMPLANT
PACK PERIPHERAL VASCULAR (CUSTOM PROCEDURE TRAY) IMPLANT
PAD ARMBOARD 7.5X6 YLW CONV (MISCELLANEOUS) ×8 IMPLANT
PADDING CAST COTTON 6X4 STRL (CAST SUPPLIES) IMPLANT
SET COLLECT BLD 21X3/4 12 (NEEDLE) IMPLANT
SPONGE GAUZE 4X4 12PLY STER LF (GAUZE/BANDAGES/DRESSINGS) ×4 IMPLANT
STOPCOCK 4 WAY LG BORE MALE ST (IV SETS) IMPLANT
SUT ETHILON 2 0 PSLX (SUTURE) ×8 IMPLANT
SUT ETHILON 3 0 PS 1 (SUTURE) IMPLANT
SUT PROLENE 5 0 C 1 24 (SUTURE) IMPLANT
SUT PROLENE 6 0 BV (SUTURE) ×20 IMPLANT
SUT PROLENE 7 0 BV 1 (SUTURE) IMPLANT
SUT SILK 3 0 (SUTURE)
SUT SILK 3-0 18XBRD TIE 12 (SUTURE) IMPLANT
SUT VIC AB 2-0 CT1 27 (SUTURE) ×6
SUT VIC AB 2-0 CT1 TAPERPNT 27 (SUTURE) ×6 IMPLANT
SUT VIC AB 3-0 SH 27 (SUTURE) ×2
SUT VIC AB 3-0 SH 27X BRD (SUTURE) ×2 IMPLANT
SUT VICRYL 4-0 PS2 18IN ABS (SUTURE) ×4 IMPLANT
SYR TB 1ML LUER SLIP (SYRINGE) ×4 IMPLANT
TRAY FOLEY W/METER SILVER 16FR (SET/KITS/TRAYS/PACK) IMPLANT
TUBING EXTENTION W/L.L. (IV SETS) IMPLANT
UNDERPAD 30X30 INCONTINENT (UNDERPADS AND DIAPERS) ×4 IMPLANT
WATER STERILE IRR 1000ML POUR (IV SOLUTION) ×4 IMPLANT
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
## 2015-01-03 NOTE — Progress Notes (Signed)
      Alert and oriented Palpable left radial artery Dressing saturated incision is without active bleeding clean dry dressing applied. Active range of motion intact and sensation to lite touch grossly intact.   COLLINS, EMMA MAUREEN PA-C
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
## 2015-01-03 NOTE — ED Notes (Signed)
Dr. Patria Mane speaking with Dr. Earl Gala. Pt does admit to drinking 2-3 beers

## 2015-01-03 NOTE — Anesthesia Postprocedure Evaluation (Signed)
  Anesthesia Post-op Note  Patient: Spencer Ward  Procedure(s) Performed: Procedure(s): BRACHIAL ARTERY REPAIR (Left) RADIAL ARTERY REPAIR  (Left) SAPHENOUS VEIN HARVEST (Left)  Patient Location: PACU  Anesthesia Type:General  Level of Consciousness: awake, alert  and oriented  Airway and Oxygen Therapy: Patient Spontanous Breathing and Patient connected to nasal cannula oxygen  Post-op Pain: mild  Post-op Assessment: Post-op Vital signs reviewed, Patient's Cardiovascular Status Stable, Respiratory Function Stable, Patent Airway and Pain level controlled              Post-op Vital Signs: stable  Last Vitals:  Filed Vitals:   01/03/15 0945  BP:   Pulse: 99  Temp:   Resp: 15    Complications: No apparent anesthesia complications

## 2015-01-03 NOTE — Anesthesia Procedure Notes (Signed)
Procedure Name: Intubation Date/Time: 01/03/2015 5:27 AM Performed by: Melina Schools Pre-anesthesia Checklist: Patient identified, Emergency Drugs available, Suction available, Patient being monitored and Timeout performed Patient Re-evaluated:Patient Re-evaluated prior to inductionOxygen Delivery Method: Circle system utilized Preoxygenation: Pre-oxygenation with 100% oxygen Intubation Type: IV induction, Rapid sequence and Cricoid Pressure applied Laryngoscope Size: Mac and 3 Grade View: Grade II Tube type: Oral Tube size: 8.0 mm Number of attempts: 1 Airway Equipment and Method: Stylet Placement Confirmation: ETT inserted through vocal cords under direct vision,  positive ETCO2 and breath sounds checked- equal and bilateral Secured at: 24 cm Tube secured with: Tape Dental Injury: Teeth and Oropharynx as per pre-operative assessment

## 2015-01-03 NOTE — Progress Notes (Signed)
Orthopedic Tech Progress Note Patient Details:  Spencer Ward 05/02/1975 [id number]  Ortho Devices Type of Ortho Device: Knee Immobilizer Ortho Device/Splint Location: Doctor ordered a 16"inch knee immobilizer for arm immobilization. Ortho Device/Splint Interventions: Application   Cammer, Mickie Bail 01/03/2015, 10:34 AM
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
## 2015-01-03 NOTE — Consult Note (Signed)
  Called to see patient in OR by Drs Janee Morn and Myra Gianotti  Median nerve, biceps tendon,and lacertus fibrosis intact  Patient had ability to make fist preop with no major deficits noted   No need for hand surgery intervention at this point in time
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
## 2015-01-03 NOTE — Transfer of Care (Signed)
Immediate Anesthesia Transfer of Care Note  Patient: Spencer Ward  Procedure(s) Performed: Procedure(s): BRACHIAL ARTERY REPAIR (Left) RADIAL ARTERY REPAIR  (Left) SAPHENOUS VEIN HARVEST (Left)  Patient Location: PACU  Anesthesia Type:General  Level of Consciousness: awake, alert , oriented and patient cooperative  Airway & Oxygen Therapy: Patient Spontanous Breathing and Patient connected to nasal cannula oxygen  Post-op Assessment: Report given to RN, Post -op Vital signs reviewed and stable and Patient moving all extremities  Post vital signs: Reviewed and stable  Last Vitals:  Filed Vitals:   01/03/15 0905  BP: 156/80  Pulse: 115  Temp: 36.8 C  Resp:     Complications: No apparent anesthesia complications

## 2015-01-03 NOTE — ED Notes (Signed)
Vascular surgeon to CT while pt still on table.

## 2015-01-03 NOTE — Anesthesia Preprocedure Evaluation (Signed)
Anesthesia Evaluation  Patient identified by MRN, date of birth, ID band Patient awake    Reviewed: Allergy & Precautions, NPO status , Patient's Chart, lab work & pertinent test results  History of Anesthesia Complications Negative for: history of anesthetic complications  Airway Mallampati: III  TM Distance: >3 FB Neck ROM: Full    Dental  (+) Teeth Intact, Dental Advisory Given   Pulmonary Current Smoker,    Pulmonary exam normal breath sounds clear to auscultation       Cardiovascular Exercise Tolerance: Good (-) hypertension(-) angina(-) Past MI negative cardio ROS Normal cardiovascular exam Rhythm:Regular Rate:Normal     Neuro/Psych negative neurological ROS     GI/Hepatic negative GI ROS, Neg liver ROS,   Endo/Other  Obesity  Renal/GU negative Renal ROS     Musculoskeletal negative musculoskeletal ROS (+)   Abdominal   Peds  Hematology negative hematology ROS (+)   Anesthesia Other Findings Day of surgery medications reviewed with the patient.  Reproductive/Obstetrics                             Anesthesia Physical Anesthesia Plan  ASA: II and emergent  Anesthesia Plan: General   Post-op Pain Management:    Induction: Intravenous and Rapid sequence  Airway Management Planned: Oral ETT  Additional Equipment:   Intra-op Plan:   Post-operative Plan: Extubation in OR  Informed Consent: I have reviewed the patients History and Physical, chart, labs and discussed the procedure including the risks, benefits and alternatives for the proposed anesthesia with the patient or authorized representative who has indicated his/her understanding and acceptance.   Dental advisory given  Plan Discussed with: CRNA  Anesthesia Plan Comments: (Risks/benefits of general anesthesia discussed with patient including risk of damage to teeth, lips, gum, and tongue, nausea/vomiting, allergic  reactions to medications, and the possibility of heart attack, stroke and death.  All patient questions answered.  Patient wishes to proceed.)        Anesthesia Quick Evaluation

## 2015-01-03 NOTE — ED Notes (Signed)
RSI kit was pulled for this patient prior to arrival. Lidocaine and Rocuronium sent back to pharmacy to credit. Succinylcholine 200 mg and Etomidate 40 mg were opened and wasted with Eliezer Mccoy, RN to verify. Pharmacy also notified and made aware. Charge RN, Shanon Brow made aware.
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
## 2015-01-03 NOTE — Progress Notes (Signed)
Inpatient Diabetes Program Recommendations  AACE/ADA: New Consensus Statement on Inpatient Glycemic Control (2015)  Target Ranges:  Prepandial:   less than 140 mg/dL      Peak postprandial:   less than 180 mg/dL (1-2 hours)      Critically ill patients:  140 - 180 mg/dL   Results for CLOYD, RAGAS (MRN [id number]) as of 01/03/2015 08:36  Ref. Range 01/03/2015 03:09  Glucose Latest Ref Range: 65-99 mg/dL 409 (H)   Review of Glycemic Control  Noted initial glucose 228 mg/dl at 8119 am. This may be stressed induced. Patient also received 4 mg of decadron. Please consider checking CBGs Q4 to evaluate.  Thanks,  Christena Deem RN, MSN, [facility] Inpatient Diabetes Coordinator Team Pager [phone] (8a-5p)

## 2015-01-04 ENCOUNTER — Encounter (HOSPITAL_COMMUNITY): Payer: Self-pay | Admitting: Surgery

## 2015-01-04 ENCOUNTER — Inpatient Hospital Stay (HOSPITAL_COMMUNITY): Payer: Self-pay

## 2015-01-04 LAB — BASIC METABOLIC PANEL
ANION GAP: 9 (ref 5–15)
BUN: 8 mg/dL (ref 6–20)
CO2: 27 mmol/L (ref 22–32)
Calcium: 7.9 mg/dL — ABNORMAL LOW (ref 8.9–10.3)
Chloride: 97 mmol/L — ABNORMAL LOW (ref 101–111)
Creatinine, Ser: 0.89 mg/dL (ref 0.61–1.24)
Glucose, Bld: 154 mg/dL — ABNORMAL HIGH (ref 65–99)
POTASSIUM: 3.9 mmol/L (ref 3.5–5.1)
SODIUM: 133 mmol/L — AB (ref 135–145)

## 2015-01-04 LAB — CBC
HCT: 26.1 % — ABNORMAL LOW (ref 39.0–52.0)
HEMOGLOBIN: 9 g/dL — AB (ref 13.0–17.0)
MCH: 30.2 pg (ref 26.0–34.0)
MCHC: 34.5 g/dL (ref 30.0–36.0)
MCV: 87.6 fL (ref 78.0–100.0)
PLATELETS: 128 10*3/uL — AB (ref 150–400)
RBC: 2.98 MIL/uL — AB (ref 4.22–5.81)
RDW: 13.1 % (ref 11.5–15.5)
WBC: 9.5 10*3/uL (ref 4.0–10.5)

## 2015-01-04 MED ORDER — KETOROLAC TROMETHAMINE 30 MG/ML IJ SOLN
30.0000 mg | Freq: Four times a day (QID) | INTRAMUSCULAR | Status: DC
  Administered 2015-01-04 – 2015-01-05 (×4): 30 mg via INTRAVENOUS
  Filled 2015-01-04 (×5): qty 1

## 2015-01-04 MED ORDER — HYDROMORPHONE HCL 1 MG/ML IJ SOLN
1.0000 mg | INTRAMUSCULAR | Status: DC | PRN
Start: 2015-01-04 — End: 2015-01-05
  Administered 2015-01-04 – 2015-01-05 (×3): 1 mg via INTRAVENOUS
  Filled 2015-01-04: qty 1
  Filled 2015-01-04 (×2): qty 2
  Filled 2015-01-04: qty 1

## 2015-01-04 MED ORDER — OXYCODONE HCL 5 MG PO TABS
10.0000 mg | ORAL_TABLET | ORAL | Status: DC | PRN
  Administered 2015-01-04 (×2): 15 mg via ORAL
  Administered 2015-01-04 – 2015-01-06 (×10): 20 mg via ORAL
  Filled 2015-01-04: qty 3
  Filled 2015-01-04 (×4): qty 4
  Filled 2015-01-04: qty 3
  Filled 2015-01-04 (×6): qty 4

## 2015-01-04 MED ORDER — CEPHALEXIN 500 MG PO CAPS
500.0000 mg | ORAL_CAPSULE | Freq: Three times a day (TID) | ORAL | Status: DC
  Administered 2015-01-04 – 2015-01-06 (×6): 500 mg via ORAL
  Filled 2015-01-04 (×6): qty 1

## 2015-01-04 MED ORDER — TRAMADOL HCL 50 MG PO TABS
50.0000 mg | ORAL_TABLET | Freq: Four times a day (QID) | ORAL | Status: DC
  Administered 2015-01-04 – 2015-01-06 (×9): 50 mg via ORAL
  Filled 2015-01-04 (×9): qty 1
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
## 2015-01-04 NOTE — Progress Notes (Signed)
  Progress Note    01/04/2015 7:31 AM 1 Day Post-Op  Subjective:  C/o soreness  Afebrile HR 60's-130's NSR/ST 110's-140's systolic 100% 3LO2NC  Filed Vitals:   01/04/15 0700  BP: 119/67  Pulse: 80  Temp:   Resp: 17    Physical Exam: Cardiac:  regular Lungs:  Non labored Incisions:  Left antecubital incision is clean with nylon sutures in place; mild bloody ooze from medial portion of incision Extremities:  +palpable left radial pulse;  Biphasic doppler signals left ulnar, radial and palmar arch   CBC    Component Value Date/Time   WBC 9.5 01/04/2015 0353   RBC 2.98* 01/04/2015 0353   HGB 9.0* 01/04/2015 0353   HCT 26.1* 01/04/2015 0353   PLT 128* 01/04/2015 0353   MCV 87.6 01/04/2015 0353   MCH 30.2 01/04/2015 0353   MCHC 34.5 01/04/2015 0353   RDW 13.1 01/04/2015 0353    BMET    Component Value Date/Time   NA 133* 01/04/2015 0353   K 3.9 01/04/2015 0353   CL 97* 01/04/2015 0353   CO2 27 01/04/2015 0353   GLUCOSE 154* 01/04/2015 0353   BUN 8 01/04/2015 0353   CREATININE 0.89 01/04/2015 0353   CALCIUM 7.9* 01/04/2015 0353   GFRNONAA >60 01/04/2015 0353   GFRAA >60 01/04/2015 0353    INR    Component Value Date/Time   INR 1.11 01/03/2015 0309     Intake/Output Summary (Last 24 hours) at 01/04/15 0731 Last data filed at 01/04/15 0700  Gross per 24 hour  Intake 4453.33 ml  Output   4205 ml  Net 248.33 ml     Assessment:  40 y.o. male is s/p:   #1: Repair of left brachial artery transection with interposition reversed saphenous vein graft #2: Repair of left radial artery transection with interposition reversed saphenous vein graft #3: Left great saphenous vein harvest #4: Ligation of multiple superficial and deep veins  1 Day Post-Op  Plan: -pt with motor and sensory in tact left hand.  Also with excellent doppler signals left PT/DP and palmar arch.  +palpable left radial pulse. -DVT prophylaxis:  None at this time due to high risk of  bleeding. -acute blood loss anemia-tolerating -still with bloody ooze at medial portion of incision.  Dressing changed several times throughout early evening and a couple of times last night. -motor and sensory are in tact left hand   Doreatha Massed, PA-C [facility] [phone] 01/04/2015 7:31 AM    Incision looks good with some serous drainage Numbness in median nerve distribution Palpable left radial and ulnar arteries  Add toradol for pain Starting keflex for contaminated wound for 2 weeks  Spencer Ward

## 2015-01-04 NOTE — Progress Notes (Signed)
Pt transferred to 2W15 via ambulation on tele.  VSS.  Bedside report given to Fannie Knee, Charity fundraiser.  Pt stable when this RN left.
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
## 2015-01-05 LAB — CBC
HEMATOCRIT: 22 % — AB (ref 39.0–52.0)
HEMOGLOBIN: 7.6 g/dL — AB (ref 13.0–17.0)
MCH: 30 pg (ref 26.0–34.0)
MCHC: 34.5 g/dL (ref 30.0–36.0)
MCV: 87 fL (ref 78.0–100.0)
Platelets: DECREASED 10*3/uL (ref 150–400)
RBC: 2.53 MIL/uL — ABNORMAL LOW (ref 4.22–5.81)
RDW: 12.7 % (ref 11.5–15.5)
WBC: 7.7 10*3/uL (ref 4.0–10.5)

## 2015-01-05 LAB — BASIC METABOLIC PANEL
ANION GAP: 8 (ref 5–15)
BUN: 7 mg/dL (ref 6–20)
CHLORIDE: 96 mmol/L — AB (ref 101–111)
CO2: 29 mmol/L (ref 22–32)
CREATININE: 0.94 mg/dL (ref 0.61–1.24)
Calcium: 8.2 mg/dL — ABNORMAL LOW (ref 8.9–10.3)
GFR calc non Af Amer: >60 mL/min (ref >60)
GLUCOSE: 112 mg/dL — AB (ref 65–99)
Potassium: 3.5 mmol/L (ref 3.5–5.1)
Sodium: 133 mmol/L — ABNORMAL LOW (ref 135–145)

## 2015-01-05 MED ORDER — HYDROMORPHONE HCL 2 MG PO TABS
1.0000 mg | ORAL_TABLET | ORAL | Status: DC | PRN
  Administered 2015-01-05: 2 mg via ORAL
  Filled 2015-01-05: qty 1

## 2015-01-05 MED ORDER — IPRATROPIUM-ALBUTEROL 0.5-2.5 (3) MG/3ML IN SOLN
3.0000 mL | Freq: Three times a day (TID) | RESPIRATORY_TRACT | Status: DC
  Administered 2015-01-05 – 2015-01-06 (×4): 3 mL via RESPIRATORY_TRACT
  Filled 2015-01-05 (×4): qty 3

## 2015-01-05 MED ORDER — KETOROLAC TROMETHAMINE 10 MG PO TABS
30.0000 mg | ORAL_TABLET | Freq: Four times a day (QID) | ORAL | Status: DC
  Administered 2015-01-05 – 2015-01-06 (×3): 30 mg via ORAL
  Filled 2015-01-05 (×6): qty 3

## 2015-01-05 MED ORDER — KETOROLAC TROMETHAMINE 10 MG PO TABS
30.0000 mg | ORAL_TABLET | Freq: Four times a day (QID) | ORAL | Status: DC
Start: 2015-01-06 — End: 2015-01-05

## 2015-01-05 MED ORDER — SENNOSIDES-DOCUSATE SODIUM 8.6-50 MG PO TABS
1.0000 | ORAL_TABLET | Freq: Every day | ORAL | Status: DC
  Administered 2015-01-05 – 2015-01-06 (×2): 1 via ORAL
  Filled 2015-01-05 (×2): qty 1

## 2015-01-05 NOTE — Progress Notes (Signed)
  [facility] Progress Note  Subjective  - POD #2  Having pain with left arm.   Objective Filed Vitals:   01/05/15 0600  BP: 103/54  Pulse: 102  Temp: 97.5 F (36.4 C)  Resp: 16    Intake/Output Summary (Last 24 hours) at 01/05/15 0827 Last data filed at 01/05/15 1610  Gross per 24 hour  Intake   1550 ml  Output   2850 ml  Net  -1300 ml    Dressing just changed by nursing staff. Minimal serosangunious drainage from left arm incision, palpable radial and ulnar pulses.   Assessment/Planning: 39 y.o. male is s/p:  #1: Repair of left brachial artery transection with interposition reversed saphenous vein graft #2: Repair of left radial artery transection with interposition reversed saphenous vein graft #3: Left great saphenous vein harvest #4: Ligation of multiple superficial and deep veins  2 Days Post-Op   Drainage improved to left incision.  Palpable radial and ulnar pulses.  Keflex started for contaminated wounds.   Raymond Gurney 01/05/2015 8:27 AM --  Laboratory CBC    Component Value Date/Time   WBC 7.7 01/05/2015 0321   HGB 7.6* 01/05/2015 0321   HCT 22.0* 01/05/2015 0321   PLT  01/05/2015 0321    PLATELET CLUMPS NOTED ON SMEAR, COUNT APPEARS DECREASED    BMET    Component Value Date/Time   NA 133* 01/05/2015 0321   K 3.5 01/05/2015 0321   CL 96* 01/05/2015 0321   CO2 29 01/05/2015 0321   GLUCOSE 112* 01/05/2015 0321   BUN 7 01/05/2015 0321   CREATININE 0.94 01/05/2015 0321   CALCIUM 8.2* 01/05/2015 0321   GFRNONAA >60 01/05/2015 0321   GFRAA >60 01/05/2015 0321    COAG Lab Results  Component Value Date   INR 1.11 01/03/2015   No results found for: PTT  Antibiotics Anti-infectives    Start     Dose/Rate Route Frequency Ordered Stop   01/04/15 1900  cephALEXin (KEFLEX) capsule 500 mg     500 mg Oral 3 times per day 01/04/15 1718     01/03/15 0330  ceFAZolin (ANCEF) IVPB 2 g/50 mL premix     2 g 100 mL/hr over 30  Minutes Intravenous  Once 01/03/15 0317 01/03/15 0358       Maris Berger, PA-C [facility] Office: [phone] Pager: [phone] 01/05/2015 8:27 AM

## 2015-01-05 NOTE — Progress Notes (Signed)
Trauma Service Note  Subjective: Patient is doing great.  Pain controlled because he has recently gotten pain medications.  Objective: Vital signs in last 24 hours: Temp:  [97.5 F (36.4 C)-100.1 F (37.8 C)] 97.5 F (36.4 C) (09/21 0600) Pulse Rate:  [102-135] 102 (09/21 0600) Resp:  [13-23] 16 (09/21 0600) BP: (103-121)/(54-94) 103/54 mmHg (09/21 0600) SpO2:  [90 %-100 %] 96 % (09/21 0732) Last BM Date: 01/02/15  Intake/Output from previous day: 09/20 0701 - 09/21 0700 In: 2000 [P.O.:1680; I.V.:320] Out: 2975 [Urine:2975] Intake/Output this shift: Total I/O In: -  Out: 1200 [Urine:1200]  General: No acute distress  Lungs: Clear  Abd: Bening  Extremities: Left arm in splint.    Neuro: Intact  Lab Results: CBC   Recent Labs  01/04/15 0353 01/05/15 0321  WBC 9.5 7.7  HGB 9.0* 7.6*  HCT 26.1* 22.0*  PLT 128* PLATELET CLUMPS NOTED ON SMEAR, COUNT APPEARS DECREASED   BMET  Recent Labs  01/04/15 0353 01/05/15 0321  NA 133* 133*  K 3.9 3.5  CL 97* 96*  CO2 27 29  GLUCOSE 154* 112*  BUN 8 7  CREATININE 0.89 0.94  CALCIUM 7.9* 8.2*   PT/INR  Recent Labs  01/03/15 0309  LABPROT 14.5  INR 1.11   ABG No results for input(s): PHART, HCO3 in the last 72 hours.  Invalid input(s): PCO2, PO2  Studies/Results: Dg Chest Port 1 View  01/04/2015   CLINICAL DATA:  Trauma with repair of brachial and radial arteries  EXAM: PORTABLE CHEST - 1 VIEW  COMPARISON:  CT chest abdomen pelvis of 01/03/2015, and chest x-ray of 01/03/2015  FINDINGS: There is mild linear atelectasis at the lung bases. No pneumothorax is seen and no pleural effusion is noted. The small left apical pneumothorax noted by CT is not definitely seen by chest x-ray. Mediastinal and hilar contours are unremarkable. The heart is within normal limits in size. Left rib fractures again are noted.  IMPRESSION: Mild bibasilar linear atelectasis. The small left apical pneumothorax noted by CT is not  visible.   Electronically Signed   By: Dwyane Dee M.D.   On: 01/04/2015 08:08    Anti-infectives: Anti-infectives    Start     Dose/Rate Route Frequency Ordered Stop   01/04/15 1900  cephALEXin (KEFLEX) capsule 500 mg     500 mg Oral 3 times per day 01/04/15 1718     01/03/15 0330  ceFAZolin (ANCEF) IVPB 2 g/50 mL premix     2 g 100 mL/hr over 30 Minutes Intravenous  Once 01/03/15 0317 01/03/15 0358      Assessment/Plan: s/p Procedure(s): BRACHIAL ARTERY REPAIR RADIAL ARTERY REPAIR  SAPHENOUS VEIN HARVEST Hemoglobin has continued to drop although I do not beleive that he has a continued source of bleeding.  Will keep overnight and reassess in the AM.  LOS: 2 days   Marta Lamas. Gae Bon, MD, FACS [phone] Trauma Surgeon 01/05/2015

## 2015-01-05 NOTE — Progress Notes (Signed)
Pt arrive to floor with Victorino Dike, RN in a wheelchair. He ambulated to the bed with no complications. Gait is steady, vital signs stable, pt is alert and oriented. Pt is experiencing pain in the left arm.

## 2015-01-06 ENCOUNTER — Other Ambulatory Visit: Payer: Self-pay | Admitting: *Deleted

## 2015-01-06 DIAGNOSIS — S45112D Laceration of brachial artery, left side, subsequent encounter: Secondary | ICD-10-CM

## 2015-01-06 DIAGNOSIS — S45112A Laceration of brachial artery, left side, initial encounter: Secondary | ICD-10-CM | POA: Diagnosis present

## 2015-01-06 DIAGNOSIS — D62 Acute posthemorrhagic anemia: Secondary | ICD-10-CM | POA: Diagnosis not present

## 2015-01-06 DIAGNOSIS — Z48812 Encounter for surgical aftercare following surgery on the circulatory system: Secondary | ICD-10-CM

## 2015-01-06 DIAGNOSIS — S2242XA Multiple fractures of ribs, left side, initial encounter for closed fracture: Secondary | ICD-10-CM | POA: Diagnosis present

## 2015-01-06 DIAGNOSIS — S0101XA Laceration without foreign body of scalp, initial encounter: Secondary | ICD-10-CM | POA: Diagnosis present

## 2015-01-06 DIAGNOSIS — S51011A Laceration without foreign body of right elbow, initial encounter: Secondary | ICD-10-CM | POA: Diagnosis present

## 2015-01-06 DIAGNOSIS — S270XXA Traumatic pneumothorax, initial encounter: Secondary | ICD-10-CM | POA: Diagnosis present

## 2015-01-06 DIAGNOSIS — T07XXXA Unspecified multiple injuries, initial encounter: Secondary | ICD-10-CM

## 2015-01-06 LAB — CBC WITH DIFFERENTIAL/PLATELET
Basophils Absolute: 0.1 10*3/uL (ref 0.0–0.1)
Basophils Relative: 1 %
EOS PCT: 3 %
Eosinophils Absolute: 0.2 10*3/uL (ref 0.0–0.7)
HEMATOCRIT: 21.4 % — AB (ref 39.0–52.0)
HEMOGLOBIN: 7.5 g/dL — AB (ref 13.0–17.0)
LYMPHS PCT: 16 %
Lymphs Abs: 1.1 10*3/uL (ref 0.7–4.0)
MCH: 30.1 pg (ref 26.0–34.0)
MCHC: 35 g/dL (ref 30.0–36.0)
MCV: 85.9 fL (ref 78.0–100.0)
MONOS PCT: 11 %
Monocytes Absolute: 0.8 10*3/uL (ref 0.1–1.0)
NEUTROS PCT: 69 %
Neutro Abs: 4.7 10*3/uL (ref 1.7–7.7)
PLATELETS: 128 10*3/uL — AB (ref 150–400)
RBC: 2.49 MIL/uL — AB (ref 4.22–5.81)
RDW: 12.4 % (ref 11.5–15.5)
WBC: 6.9 10*3/uL (ref 4.0–10.5)

## 2015-01-06 MED ORDER — CEPHALEXIN 500 MG PO CAPS: 500.0000 mg | ORAL_CAPSULE | Freq: Three times a day (TID) | ORAL | Status: AC

## 2015-01-06 MED ORDER — MAGNESIUM CITRATE PO SOLN
0.5000 | Freq: Once | ORAL | Status: AC
  Administered 2015-01-06: 0.5 via ORAL
  Filled 2015-01-06: qty 296

## 2015-01-06 MED ORDER — POLYETHYLENE GLYCOL 3350 17 G PO PACK
17.0000 g | PACK | Freq: Every day | ORAL | Status: DC
  Administered 2015-01-06: 17 g via ORAL
  Filled 2015-01-06: qty 1

## 2015-01-06 MED ORDER — OXYCODONE-ACETAMINOPHEN 10-325 MG PO TABS
1.0000 | ORAL_TABLET | ORAL | Status: AC | PRN
Start: 2015-01-06 — End: ?

## 2015-01-06 MED ORDER — TRAMADOL HCL 50 MG PO TABS: 50.0000 mg | ORAL_TABLET | Freq: Four times a day (QID) | ORAL | Status: AC

## 2015-01-06 MED ORDER — IPRATROPIUM-ALBUTEROL 0.5-2.5 (3) MG/3ML IN SOLN: 3.0000 mL | Freq: Four times a day (QID) | RESPIRATORY_TRACT | Status: DC | PRN
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
## 2015-01-06 NOTE — Discharge Instructions (Addendum)
He may slowly start to bend his left arm.  No weight lifting just motion of the elbow and continue elevation of the hand and elbow above the heart level when at rest.  Wash wounds daily in shower with soap and water. Do not soak. Apply antibiotic ointment (e.g. Neosporin) twice daily and as needed to keep moist. Cover with dry dressing.  No driving while taking oxycodone.

## 2015-01-06 NOTE — Progress Notes (Signed)
[facility] [place]  Subjective  - Doing well still edema and improved sensation to the tips of the fingers.   Objective 123/78 97 97.7 F (36.5 C) (Oral) 18 96%  Intake/Output Summary (Last 24 hours) at 01/06/15 0749 Last data filed at 01/06/15 0256  Gross per 24 hour  Intake   1420 ml  Output   3100 ml  Net  -1680 ml   Left arm incision clean and dry no active Edema throughout the left arm Palpable radial   Assessment/Planning: POD # 3 #1: Repair of left brachial artery transection with interposition reversed saphenous vein graft #2: Repair of left radial artery transection with interposition reversed saphenous vein graft #3: Left great saphenous vein harvest #4: Ligation of multiple superficial and deep veins    Stable disposition from a vascular point of view  Clinton Gallant [facility] 01/06/2015 7:49 AM --  Laboratory Lab Results:  Recent Labs  01/05/15 0321 01/06/15 0555  WBC 7.7 6.9  HGB 7.6* 7.5*  HCT 22.0* 21.4*  PLT PLATELET CLUMPS NOTED ON SMEAR, COUNT APPEARS DECREASED 128*   BMET  Recent Labs  01/04/15 0353 01/05/15 0321  NA 133* 133*  K 3.9 3.5  CL 97* 96*  CO2 27 29  GLUCOSE 154* 112*  BUN 8 7  CREATININE 0.89 0.94  CALCIUM 7.9* 8.2*    COAG Lab Results  Component Value Date   INR 1.11 01/03/2015   No results found for: PTT

## 2015-01-06 NOTE — Care Management Note (Signed)
Case Management Note  Patient Details  Name: Spencer Ward MRN: [id number] Date of Birth: 01/20/1976  Subjective/Objective:   Pt for dc home today with spouse.  Pt uninsured; is eligible for medication assistance through [facility] program.                  Action/Plan: MATCH letter given to pt with explanation of program benefits.  Pt states he was not aware he was going home today; may have trouble finding a ride home.  Bedside nurse to notify CM/CSW if pt has no ride.    Expected Discharge Date:  01/06/15            Expected Discharge Plan:  Home/Self Care  In-House Referral:     Discharge planning Services  CM Consult, [facility] Program  Post Acute Care Choice:    Choice offered to:     DME Arranged:    DME Agency:     HH Arranged:    HH Agency:     Status of Service:  Completed, signed off  Medicare Important Message Given:    Date Medicare IM Given:    Medicare IM give by:    Date Additional Medicare IM Given:    Additional Medicare Important Message give by:     If discussed at Long Length of Stay Meetings, dates discussed:    Additional Comments:  Quintella Baton, RN, BSN  Trauma/Neuro ICU Case Manager [phone]

## 2015-01-06 NOTE — Progress Notes (Signed)
Reviewed discharge instructions with patient. No IV's to remove. Will continue to monitor until his ride arrives.
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
## 2015-01-07 ENCOUNTER — Telehealth: Payer: Self-pay | Admitting: Surgery

## 2015-01-07 NOTE — Telephone Encounter (Signed)
-----   Message from Sharee Pimple, RN sent at 01/06/2015  5:05 PM EDT ----- Regarding: Schedule   ----- Message -----    From: Lars Mage, PA-C    Sent: 01/06/2015   3:17 PM      To: Vvs Charge Pool  F/U with Dr. Myra Gianotti in 2 weeks with arterial duplex of the left upper ext.  S/P brachial artery repair

## 2015-01-07 NOTE — Telephone Encounter (Signed)
Spoke with pts wife, dpm °

## 2015-01-08 ENCOUNTER — Encounter (HOSPITAL_COMMUNITY): Payer: Self-pay | Admitting: Emergency Medicine

## 2015-01-08 ENCOUNTER — Inpatient Hospital Stay (HOSPITAL_COMMUNITY)
Admission: EM | Admit: 2015-01-08 | Discharge: 2015-01-12 | DRG: 863 | Disposition: A | Discharge status: Home / Self Care | Payer: Self-pay | Attending: Surgery | Admitting: Surgery

## 2015-01-08 DIAGNOSIS — F17210 Nicotine dependence, cigarettes, uncomplicated: Secondary | ICD-10-CM | POA: Diagnosis present

## 2015-01-08 DIAGNOSIS — S51812D Laceration without foreign body of left forearm, subsequent encounter: Secondary | ICD-10-CM

## 2015-01-08 DIAGNOSIS — Y832 Surgical operation with anastomosis, bypass or graft as the cause of abnormal reaction of the patient, or of later complication, without mention of misadventure at the time of the procedure: Secondary | ICD-10-CM | POA: Diagnosis present

## 2015-01-08 DIAGNOSIS — F4329 Adjustment disorder with other symptoms: Secondary | ICD-10-CM

## 2015-01-08 DIAGNOSIS — R609 Edema, unspecified: Secondary | ICD-10-CM

## 2015-01-08 DIAGNOSIS — S45112D Laceration of brachial artery, left side, subsequent encounter: Secondary | ICD-10-CM

## 2015-01-08 DIAGNOSIS — L03114 Cellulitis of left upper limb: Secondary | ICD-10-CM | POA: Diagnosis present

## 2015-01-08 DIAGNOSIS — S55112D Laceration of radial artery at forearm level, left arm, subsequent encounter: Secondary | ICD-10-CM

## 2015-01-08 DIAGNOSIS — T8140XA Infection following a procedure, unspecified, initial encounter: Secondary | ICD-10-CM | POA: Diagnosis present

## 2015-01-08 DIAGNOSIS — S2242XD Multiple fractures of ribs, left side, subsequent encounter for fracture with routine healing: Secondary | ICD-10-CM

## 2015-01-08 DIAGNOSIS — M7989 Other specified soft tissue disorders: Secondary | ICD-10-CM

## 2015-01-08 DIAGNOSIS — S41119A Laceration without foreign body of unspecified upper arm, initial encounter: Secondary | ICD-10-CM | POA: Diagnosis present

## 2015-01-08 DIAGNOSIS — T814XXA Infection following a procedure, initial encounter: Principal | ICD-10-CM | POA: Diagnosis present

## 2015-01-08 NOTE — ED Notes (Addendum)
Patient here after surgery with Dr Myra Gianotti.  Patient with increased swelling and numbness in left hand.  Patient had extensive vascular surgery on left arm.  Redness and warmth at surgery site.  Pulses are intact in arm.  Thumb, pointer finger and middle finger on left hand are numb, but warm.  4th and 5th finger on hand are cold.  Patient is also having some chest pain that started this evening.  Patient does have rib fractures.

## 2015-01-09 ENCOUNTER — Encounter (HOSPITAL_COMMUNITY): Payer: Self-pay | Admitting: Radiology

## 2015-01-09 ENCOUNTER — Emergency Department (HOSPITAL_COMMUNITY): Payer: Self-pay

## 2015-01-09 ENCOUNTER — Inpatient Hospital Stay (HOSPITAL_COMMUNITY): Payer: Self-pay

## 2015-01-09 DIAGNOSIS — T8140XA Infection following a procedure, unspecified, initial encounter: Secondary | ICD-10-CM | POA: Diagnosis present

## 2015-01-09 DIAGNOSIS — S41119A Laceration without foreign body of unspecified upper arm, initial encounter: Secondary | ICD-10-CM | POA: Diagnosis present

## 2015-01-09 DIAGNOSIS — M7989 Other specified soft tissue disorders: Secondary | ICD-10-CM

## 2015-01-09 LAB — BASIC METABOLIC PANEL
Anion gap: 9 (ref 5–15)
BUN: 12 mg/dL (ref 6–20)
CALCIUM: 9 mg/dL (ref 8.9–10.3)
CO2: 29 mmol/L (ref 22–32)
CREATININE: 1.07 mg/dL (ref 0.61–1.24)
Chloride: 98 mmol/L — ABNORMAL LOW (ref 101–111)
Glucose, Bld: 107 mg/dL — ABNORMAL HIGH (ref 65–99)
Potassium: 3.6 mmol/L (ref 3.5–5.1)
SODIUM: 136 mmol/L (ref 135–145)

## 2015-01-09 LAB — CBC
HCT: 23.5 % — ABNORMAL LOW (ref 39.0–52.0)
HEMATOCRIT: 20.3 % — AB (ref 39.0–52.0)
HEMOGLOBIN: 7.2 g/dL — AB (ref 13.0–17.0)
Hemoglobin: 8.4 g/dL — ABNORMAL LOW (ref 13.0–17.0)
MCH: 30.4 pg (ref 26.0–34.0)
MCH: 31 pg (ref 26.0–34.0)
MCHC: 35.5 g/dL (ref 30.0–36.0)
MCHC: 35.7 g/dL (ref 30.0–36.0)
MCV: 85.7 fL (ref 78.0–100.0)
MCV: 86.7 fL (ref 78.0–100.0)
PLATELETS: 267 10*3/uL (ref 150–400)
Platelets: 200 10*3/uL (ref 150–400)
RBC: 2.37 MIL/uL — ABNORMAL LOW (ref 4.22–5.81)
RBC: 2.71 MIL/uL — AB (ref 4.22–5.81)
RDW: 12.4 % (ref 11.5–15.5)
RDW: 12.6 % (ref 11.5–15.5)
WBC: 7.1 10*3/uL (ref 4.0–10.5)
WBC: 7.7 10*3/uL (ref 4.0–10.5)

## 2015-01-09 LAB — I-STAT TROPONIN, ED: TROPONIN I, POC: 0 ng/mL (ref 0.00–0.08)

## 2015-01-09 LAB — CK: Total CK: 732 U/L — ABNORMAL HIGH (ref 49–397)

## 2015-01-09 LAB — CREATININE, SERUM: Creatinine, Ser: 0.9 mg/dL (ref 0.61–1.24)

## 2015-01-09 MED ORDER — HYDROMORPHONE HCL 1 MG/ML IJ SOLN
1.0000 mg | Freq: Once | INTRAMUSCULAR | Status: AC
  Administered 2015-01-09: 1 mg via INTRAVENOUS
  Filled 2015-01-09: qty 1

## 2015-01-09 MED ORDER — HYDRALAZINE HCL 20 MG/ML IJ SOLN: 5.0000 mg | INTRAMUSCULAR | Status: DC | PRN

## 2015-01-09 MED ORDER — POLYETHYLENE GLYCOL 3350 17 G PO PACK
17.0000 g | PACK | Freq: Every day | ORAL | Status: DC | PRN
  Filled 2015-01-09: qty 1

## 2015-01-09 MED ORDER — ACETAMINOPHEN 650 MG RE SUPP: 325.0000 mg | RECTAL | Status: DC | PRN

## 2015-01-09 MED ORDER — LABETALOL HCL 5 MG/ML IV SOLN: 10.0000 mg | INTRAVENOUS | Status: DC | PRN

## 2015-01-09 MED ORDER — VANCOMYCIN HCL 10 G IV SOLR
1500.0000 mg | Freq: Once | INTRAVENOUS | Status: AC
  Administered 2015-01-09: 1500 mg via INTRAVENOUS
  Filled 2015-01-09: qty 1500

## 2015-01-09 MED ORDER — IOHEXOL 350 MG/ML SOLN
100.0000 mL | Freq: Once | INTRAVENOUS | Status: AC | PRN
  Administered 2015-01-09: 100 mL via INTRAVENOUS

## 2015-01-09 MED ORDER — ACETAMINOPHEN 325 MG PO TABS: 325.0000 mg | ORAL_TABLET | ORAL | Status: DC | PRN

## 2015-01-09 MED ORDER — SODIUM CHLORIDE 0.9 % IV BOLUS (SEPSIS)
1000.0000 mL | Freq: Once | INTRAVENOUS | Status: AC
  Administered 2015-01-09: 1000 mL via INTRAVENOUS

## 2015-01-09 MED ORDER — OXYCODONE-ACETAMINOPHEN 5-325 MG PO TABS
1.0000 | ORAL_TABLET | ORAL | Status: DC | PRN
Start: 2015-01-09 — End: 2015-01-12
  Administered 2015-01-09 – 2015-01-12 (×13): 2 via ORAL
  Filled 2015-01-09 (×13): qty 2

## 2015-01-09 MED ORDER — ONDANSETRON HCL 4 MG/2ML IJ SOLN: 4.0000 mg | Freq: Four times a day (QID) | INTRAMUSCULAR | Status: DC | PRN

## 2015-01-09 MED ORDER — METOPROLOL TARTRATE 1 MG/ML IV SOLN: 2.0000 mg | INTRAVENOUS | Status: DC | PRN

## 2015-01-09 MED ORDER — ALUM & MAG HYDROXIDE-SIMETH 200-200-20 MG/5ML PO SUSP: 15.0000 mL | ORAL | Status: DC | PRN

## 2015-01-09 MED ORDER — VANCOMYCIN HCL 10 G IV SOLR
1500.0000 mg | Freq: Two times a day (BID) | INTRAVENOUS | Status: DC
  Administered 2015-01-09 – 2015-01-12 (×6): 1500 mg via INTRAVENOUS
  Filled 2015-01-09 (×7): qty 1500

## 2015-01-09 MED ORDER — VANCOMYCIN HCL IN DEXTROSE 1-5 GM/200ML-% IV SOLN
1000.0000 mg | Freq: Two times a day (BID) | INTRAVENOUS | Status: DC
  Filled 2015-01-09 (×2): qty 200

## 2015-01-09 MED ORDER — SENNA 8.6 MG PO TABS
1.0000 | ORAL_TABLET | Freq: Two times a day (BID) | ORAL | Status: DC
  Administered 2015-01-10 – 2015-01-12 (×6): 8.6 mg via ORAL
  Filled 2015-01-09 (×8): qty 1

## 2015-01-09 MED ORDER — ENOXAPARIN SODIUM 40 MG/0.4ML ~~LOC~~ SOLN
40.0000 mg | SUBCUTANEOUS | Status: DC
  Administered 2015-01-10 – 2015-01-12 (×3): 40 mg via SUBCUTANEOUS
  Filled 2015-01-09 (×5): qty 0.4

## 2015-01-09 MED ORDER — HYDROMORPHONE HCL 1 MG/ML IJ SOLN
0.5000 mg | INTRAMUSCULAR | Status: DC | PRN
Start: 2015-01-09 — End: 2015-01-12
  Administered 2015-01-09 – 2015-01-11 (×13): 1 mg via INTRAVENOUS
  Filled 2015-01-09 (×13): qty 1

## 2015-01-09 MED ORDER — FLEET ENEMA 7-19 GM/118ML RE ENEM: 1.0000 | ENEMA | Freq: Once | RECTAL | Status: DC | PRN

## 2015-01-09 MED ORDER — BISACODYL 10 MG RE SUPP: 10.0000 mg | Freq: Every day | RECTAL | Status: DC | PRN

## 2015-01-09 NOTE — ED Notes (Signed)
Dr Orlan Leavens in w/pt.
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
## 2015-01-09 NOTE — Consult Note (Signed)
CHART REVIEWED OP NOTES REVIEWED CARE DISCUSSED WITH DR. Mina Marble WHO WAS HAND SURGEON ON CALL NIGHT OF INJURY AND VISUALIZED MEDIAN NERVE PT IN POST OP COURSE WITH SWELLING AND TINGLING IN FINGERS PT WITH DRAINING WOUND SEEN/EVALUATED BY VASCULAR I WAS ASKED TO SEE PATIENT FOR POSSIBLE COMPARTMENT SYNDROME  LEFT ARM SWOLLEN, MODERATE ECCHYMOSES AROUND INCISION AND MORE SO DISTALLY COMPARTMENTS SWOLLEN BUT SOFT ABLE TO ALMOST MAKE FULL FIST, GOOD DIGITAL MOTION WITHOUT PAIN ON PASSIVE STRETCH OF DIGITS UNABLE TO FLEX THUMB IP JOINT UNABLE TO FLEX FDP TO INDEX  RECOMMENDATIONS: LONG ARM SPLINT KEEP ARM STILL ALLOW SOFT TISSUE SWELLING TO GO DOWN, ELEVATE HAND AVOID REPETITIVE ELBOW FLEXION/EXTENSION, OK TO MOVE FINGERS BUT WOULD KEEP ELBOW FOREARM AND WRIST STILL UNTIL SOFT TISSUE SWELLING HAS GONE DOWN ORAL ABX AND IMMOBILZATION DO NOT RECOMMEND FOREARM FASCIOTOMY OR NERVE EXPLORATION BASED ON OPERATIVE NOTE AND DISCUSSION WITH DR. Mina Marble PT WITH AIN NEUROPRAXIA CARE DISCUSSED WITH DR. Imogene Burn.

## 2015-01-09 NOTE — Progress Notes (Signed)
VASCULAR LAB PRELIMINARY  PRELIMINARY  PRELIMINARY  PRELIMINARY  Left upper extremity venous duplex completed.    Preliminary report:  There is no obvious evidence of DVT or SVT noted in the left upper extremity.   KANADY, CANDACE, RVT 01/09/2015, 8:53 AM
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
## 2015-01-09 NOTE — ED Notes (Signed)
Babs Bertin, aware.

## 2015-01-09 NOTE — ED Notes (Signed)
Patient transported to X-ray 
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
## 2015-01-09 NOTE — ED Notes (Signed)
Paged Ortho Tech 
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
## 2015-01-09 NOTE — Progress Notes (Signed)
Utilization Review Completed. Genese M Crutchfield, RNCM [phone] 

## 2015-01-10 LAB — BASIC METABOLIC PANEL
Anion gap: 7 (ref 5–15)
BUN: 8 mg/dL (ref 6–20)
CHLORIDE: 104 mmol/L (ref 101–111)
CO2: 25 mmol/L (ref 22–32)
Calcium: 8.7 mg/dL — ABNORMAL LOW (ref 8.9–10.3)
Creatinine, Ser: 1.02 mg/dL (ref 0.61–1.24)
GFR calc Af Amer: >60 mL/min (ref >60)
GFR calc non Af Amer: >60 mL/min (ref >60)
Glucose, Bld: 95 mg/dL (ref 65–99)
POTASSIUM: 3.8 mmol/L (ref 3.5–5.1)
SODIUM: 136 mmol/L (ref 135–145)

## 2015-01-10 LAB — CBC
HEMATOCRIT: 22.5 % — AB (ref 39.0–52.0)
HEMOGLOBIN: 7.8 g/dL — AB (ref 13.0–17.0)
MCH: 29.7 pg (ref 26.0–34.0)
MCHC: 34.7 g/dL (ref 30.0–36.0)
MCV: 85.6 fL (ref 78.0–100.0)
Platelets: 263 10*3/uL (ref 150–400)
RBC: 2.63 MIL/uL — AB (ref 4.22–5.81)
RDW: 12.5 % (ref 11.5–15.5)
WBC: 6.8 10*3/uL (ref 4.0–10.5)

## 2015-01-10 NOTE — Progress Notes (Signed)
Patient lying in bed, no needs expressed at this time. Call light within reach 

## 2015-01-10 NOTE — Progress Notes (Signed)
Patient requesting pain medication. Call light within reach, SCD's put on.
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
## 2015-01-11 ENCOUNTER — Inpatient Hospital Stay (HOSPITAL_COMMUNITY): Payer: MEDICAID

## 2015-01-11 ENCOUNTER — Inpatient Hospital Stay (HOSPITAL_COMMUNITY): Payer: Self-pay
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
## 2015-01-11 NOTE — Progress Notes (Signed)
IV Vancomycin started at 1750pm, patient yelled out and complained with burning at IV site.  Also stated thatsite was burning on last nite but it stopped.   IV team consult initiated.  Governor Specking, RN
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
## 2015-01-12 MED ORDER — SULFAMETHOXAZOLE-TRIMETHOPRIM 800-160 MG PO TABS
1.0000 | ORAL_TABLET | Freq: Two times a day (BID) | ORAL | Status: DC
  Administered 2015-01-12: 1 via ORAL
  Filled 2015-01-12: qty 1

## 2015-01-12 MED ORDER — SULFAMETHOXAZOLE-TRIMETHOPRIM 800-160 MG PO TABS: 1.0000 | ORAL_TABLET | Freq: Two times a day (BID) | ORAL | Status: AC
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
## 2015-01-12 NOTE — Progress Notes (Signed)
Spoke with Dr. Myra Gianotti about d/c antibiotics.  He wants the pt to go home on Bactrim.  Will call the pt to have him discontinue Keflex as he was already discharged from the hospital.    Haskell Riling, [facility] 01/12/2015 2:28 PM
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
## 2015-01-13 LAB — CDS SEROLOGY

## 2015-01-14 LAB — CULTURE, BLOOD (ROUTINE X 2)
CULTURE: NO GROWTH
Culture: NO GROWTH

## 2015-01-18 ENCOUNTER — Telehealth: Payer: Self-pay

## 2015-01-18 NOTE — Telephone Encounter (Signed)
Phone call from pt.  Reported the left arm has become "hard" around the incisional area and inner aspect of forearm.  Reported "the arm feels tough, as if I am flexing my muscles."  Stated this was 1st noticed on Saturday.  Reported the arm is still swollen.  Denied any increased pain.  Reported intermittent "prickly" sensation in the 1st 3 fingers (L) hand.  Also reported a "small open area, < 1 cm. in mid-incision, that will drain an orange /red thin fluid if I do arm curls."  Reported he tries to do the arm curls to assist in getting the arm to drain.  Stated "I'm concerned that the incision is healing too fast, and the drainage won't be able to come out."  Denid fever/ chills. Denied any increased pain; explained the prickly sensation in the fingers is uncomfortable.  Stated the 1st 3 fingers get cold at times and he tries to keep them warm; reported nailbeds blanch when his wife checks his circulation.  Discussed pt's symptoms with Dr. Arbie Cookey.  Recommended to keep appt. on 10/10 with Dr. Myra Gianotti.  Pt. notified of Dr. Bosie Helper recommendations.  Encouraged to call if symptoms worsen, prior to f/u appt. 10/10.  Verb. understanding

## 2015-01-19 ENCOUNTER — Ambulatory Visit (INDEPENDENT_AMBULATORY_CARE_PROVIDER_SITE_OTHER): Payer: Self-pay | Admitting: Vascular Surgery

## 2015-01-19 ENCOUNTER — Telehealth: Payer: Self-pay

## 2015-01-19 ENCOUNTER — Encounter: Payer: Self-pay | Admitting: Vascular Surgery

## 2015-01-19 ENCOUNTER — Ambulatory Visit (HOSPITAL_COMMUNITY)
Admission: RE | Admit: 2015-01-19 | Discharge: 2015-01-19 | Disposition: A | Discharge status: Home / Self Care | Payer: Self-pay | Source: Ambulatory Visit | Attending: Vascular Surgery | Admitting: Vascular Surgery

## 2015-01-19 VITALS — BP 128/64 | HR 76 | Temp 98.3°F | Ht 66.0 in | Wt 181.2 lb

## 2015-01-19 DIAGNOSIS — R200 Anesthesia of skin: Secondary | ICD-10-CM

## 2015-01-19 DIAGNOSIS — Z48812 Encounter for surgical aftercare following surgery on the circulatory system: Secondary | ICD-10-CM

## 2015-01-19 DIAGNOSIS — M79646 Pain in unspecified finger(s): Secondary | ICD-10-CM

## 2015-01-19 DIAGNOSIS — M79642 Pain in left hand: Secondary | ICD-10-CM | POA: Insufficient documentation

## 2015-01-19 DIAGNOSIS — M79643 Pain in unspecified hand: Secondary | ICD-10-CM
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
## 2015-01-19 NOTE — Telephone Encounter (Signed)
Pt. called to report worsening of the numbness in left thumb, index and middle fingers and of new onset of numbness in the left ring finger.  Stated the tips of the digits are white, and the nailbeds are not blanching as they were when checked yesterday.  Reported "steady pain" in the above fingers.  Also, reported the left forearm firmness has extended to include the wrist area, and that this is a change from yesterday.   Per Dr. Edilia Bo, add pt. on today for left UE arterial duplex and OV.  Pt. notified.  Unable to arrive in time for 12:00 PM ultrasound, but agreed to appt. @ 3:30 PM.

## 2015-01-21 ENCOUNTER — Encounter: Payer: Self-pay | Admitting: Surgery

## 2015-01-24 ENCOUNTER — Ambulatory Visit (INDEPENDENT_AMBULATORY_CARE_PROVIDER_SITE_OTHER): Payer: Self-pay | Admitting: Surgery

## 2015-01-24 ENCOUNTER — Encounter: Payer: Self-pay | Admitting: Surgery

## 2015-01-24 ENCOUNTER — Other Ambulatory Visit (HOSPITAL_COMMUNITY): Payer: Self-pay

## 2015-01-24 ENCOUNTER — Ambulatory Visit (HOSPITAL_COMMUNITY)
Admission: RE | Admit: 2015-01-24 | Discharge: 2015-01-24 | Disposition: A | Discharge status: Home / Self Care | Payer: Self-pay | Source: Ambulatory Visit | Attending: Surgery | Admitting: Surgery

## 2015-01-24 VITALS — BP 112/70 | HR 66 | Temp 97.9°F | Resp 14 | Ht 67.0 in | Wt 181.6 lb

## 2015-01-24 DIAGNOSIS — S45112D Laceration of brachial artery, left side, subsequent encounter: Secondary | ICD-10-CM | POA: Insufficient documentation

## 2015-01-24 DIAGNOSIS — Z48812 Encounter for surgical aftercare following surgery on the circulatory system: Secondary | ICD-10-CM

## 2015-01-24 DIAGNOSIS — S45102S Unspecified injury of brachial artery, left side, sequela: Secondary | ICD-10-CM

## 2015-01-24 DIAGNOSIS — X58XXXD Exposure to other specified factors, subsequent encounter: Secondary | ICD-10-CM | POA: Insufficient documentation

## 2015-01-24 DIAGNOSIS — Z9889 Other specified postprocedural states: Secondary | ICD-10-CM | POA: Insufficient documentation

## 2015-01-24 NOTE — Addendum Note (Signed)
Addended by: Adria Dill L on: 01/24/2015 03:16 PM   Modules accepted: Orders
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
## 2015-01-28 ENCOUNTER — Telehealth: Payer: Self-pay | Admitting: Surgery

## 2015-01-28 NOTE — Telephone Encounter (Signed)
Received voicemail from Spencer Ward at Dr. Ronie SpiesWeingold's office [phone]). Pt has an appointment on 02/01/15 at 9:10 am. Patient is aware.

## 2015-01-31 ENCOUNTER — Ambulatory Visit: Payer: Self-pay | Admitting: Surgery

## 2015-02-24 ENCOUNTER — Encounter: Payer: Self-pay | Admitting: *Deleted

## 2015-02-28 ENCOUNTER — Ambulatory Visit: Payer: Self-pay

## 2016-01-26 ENCOUNTER — Encounter: Payer: Self-pay | Admitting: Family

## 2016-01-30 ENCOUNTER — Ambulatory Visit: Payer: Self-pay | Admitting: Family

## 2016-01-30 ENCOUNTER — Other Ambulatory Visit (HOSPITAL_COMMUNITY): Payer: Self-pay

## 2016-01-31 ENCOUNTER — Other Ambulatory Visit (HOSPITAL_COMMUNITY): Payer: Self-pay

## 2016-01-31 ENCOUNTER — Ambulatory Visit: Payer: Self-pay | Admitting: Family
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
